# Patient Record
Sex: Female | Born: 1980 | Race: White | Hispanic: No | Marital: Married | Smoking: Never smoker
Health system: Southern US, Community
[De-identification: ages and names within clinical notes are randomized; demographics above are authoritative.]

## PROBLEM LIST (undated history)

## (undated) DIAGNOSIS — D62 Acute posthemorrhagic anemia: Secondary | ICD-10-CM

## (undated) DIAGNOSIS — Z1589 Genetic susceptibility to other disease: Secondary | ICD-10-CM

## (undated) DIAGNOSIS — E7212 Methylenetetrahydrofolate reductase deficiency: Secondary | ICD-10-CM

## (undated) DIAGNOSIS — O409XX0 Polyhydramnios, unspecified trimester, not applicable or unspecified: Secondary | ICD-10-CM

## (undated) DIAGNOSIS — J45909 Unspecified asthma, uncomplicated: Secondary | ICD-10-CM

## (undated) HISTORY — DX: Methylenetetrahydrofolate reductase deficiency: E72.12

## (undated) HISTORY — DX: Genetic susceptibility to other disease: Z15.89

## (undated) HISTORY — DX: Polyhydramnios, unspecified trimester, not applicable or unspecified: O40.9XX0

## (undated) HISTORY — DX: Unspecified asthma, uncomplicated: J45.909

## (undated) HISTORY — PX: NO PAST SURGERIES: SHX2092

---

## 2009-11-23 ENCOUNTER — Ambulatory Visit (HOSPITAL_COMMUNITY): Admission: RE | Admit: 2009-11-23 | Discharge: 2009-11-23 | Payer: Self-pay | Admitting: Obstetrics and Gynecology

## 2009-12-03 ENCOUNTER — Inpatient Hospital Stay (HOSPITAL_COMMUNITY): Admission: AD | Admit: 2009-12-03 | Discharge: 2009-12-03 | Payer: Self-pay | Admitting: Obstetrics and Gynecology

## 2010-03-24 ENCOUNTER — Inpatient Hospital Stay (HOSPITAL_COMMUNITY): Admission: AD | Admit: 2010-03-24 | Discharge: 2010-03-27 | Payer: Self-pay | Admitting: Obstetrics and Gynecology

## 2010-09-07 LAB — CBC
Hemoglobin: 12.1 g/dL (ref 12.0–15.0)
MCH: 30.4 pg (ref 26.0–34.0)
MCH: 30.1 pg (ref 26.0–34.0)
MCV: 87.3 fL (ref 78.0–100.0)
Platelets: 269 10*3/uL (ref 150–400)
RBC: 3.57 MIL/uL — ABNORMAL LOW (ref 3.87–5.11)
RDW: 14.6 % (ref 11.5–15.5)
RDW: 14.4 % (ref 11.5–15.5)
WBC: 9.4 10*3/uL (ref 4.0–10.5)

## 2010-09-11 LAB — URINALYSIS, ROUTINE W REFLEX MICROSCOPIC
Protein, ur: NEGATIVE mg/dL
Urobilinogen, UA: 0.2 mg/dL (ref 0.0–1.0)

## 2010-09-11 LAB — URINE CULTURE

## 2010-09-11 LAB — URINE MICROSCOPIC-ADD ON

## 2012-06-25 NOTE — L&D Delivery Note (Signed)
Delivery Note At 2:43 PM a viable female was delivered via Vaginal, Spontaneous Delivery (Presentation:DOA ;  ).  APGAR: 8/9, ; weight .  Pending. Easy delivery of shoulders after head. No nuchal cord Placenta status: ,intact/ 3-VC .  Cord:  with the following complications:none .  Cord pH: not indicated  Anesthesia: Epidural  Episiotomy: None Lacerations: 2nd degree;Perineal Suture Repair: 2.0 3.0 vicryl rapide Est. Blood Loss (mL): 350 cc  Mom to postpartum.  Baby to nursery-stable.  Annalysa Mohammad A. 01/06/2013, 3:04 PM

## 2012-07-08 LAB — OB RESULTS CONSOLE RPR: RPR: NONREACTIVE

## 2012-07-08 LAB — OB RESULTS CONSOLE HIV ANTIBODY (ROUTINE TESTING): HIV: NONREACTIVE

## 2012-07-08 LAB — OB RESULTS CONSOLE ABO/RH

## 2012-07-28 LAB — OB RESULTS CONSOLE GC/CHLAMYDIA: Gonorrhea: NEGATIVE

## 2012-12-12 LAB — OB RESULTS CONSOLE GBS: GBS: NEGATIVE

## 2012-12-30 ENCOUNTER — Encounter (HOSPITAL_COMMUNITY): Payer: Self-pay | Admitting: *Deleted

## 2012-12-30 ENCOUNTER — Telehealth (HOSPITAL_COMMUNITY): Payer: Self-pay | Admitting: *Deleted

## 2012-12-30 NOTE — Telephone Encounter (Signed)
Preadmission screen  

## 2013-01-05 ENCOUNTER — Other Ambulatory Visit: Payer: Self-pay | Admitting: Obstetrics

## 2013-01-05 NOTE — H&P (Signed)
Teresa Page is a 32 y.o. G2P1001 at 39'0 by LMP c/w 11 wk u/s  presenting for IOL due to polyhydramnios. Pt notes rare contractions. Good fetal movement, No vaginal bleeding, not leaking fluid.  [facility] at [facility] 11 wks - macrosomia. U/s at 38'6 revealed EFW at 4309g, 9'8, 97%. Pt aware risks of shoulder dystocia and permanent brachial plexus injury, maternal tissue trauma, risks of poor fetal perfusion during dystocia. Pt declines PCS for macrosomia and would like TOL. We discussed following her labor curve. AC 99%, 2 cm bigger than HC at 75%. BPP 8/8. 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 S/p MFM, recommendation not to change care due to this testing. Pt on additional folic acid and baby ASA daily - PUPPS. On hydroxyzine prn  - 24 # wt gain - b/l hand and feet fetal polydactyly with bone present in all extra digits   Prenatal Transfer Tool  Maternal Diabetes: No Genetic Screening: Declined Maternal Ultrasounds/Referrals: Abnormal:  Findings:   Other:macrosomia, polyhydramnios, polydactyly Fetal Ultrasounds or other Referrals:  None Maternal Substance Abuse:  No Significant Maternal Medications:  None Significant Maternal Lab Results: None     OB History   Grav Para Term Preterm Abortions TAB SAB Ect Mult Living   2 1 1       1      Past Medical History  Diagnosis Date  . Asthma   . Polyhydramnios   . MTHFR mutation    Past Surgical History  Procedure Laterality Date  . No past surgeries     Family History: family history includes Cancer in her paternal aunt; Diabetes in her father; Heart attack in her father and maternal grandfather; Heart disease in her father, paternal grandfather, and paternal uncle; Hypertension in her mother; and  Other in her sister. Social History:  reports that she has never smoked. She has never used smokeless tobacco. She reports that she does not drink alcohol or use illicit drugs.  Review of Systems - Negative except occasional contraction     Last menstrual period 04/08/2012, unknown if currently breastfeeding.  Physical Exam:  Gen: well appearing, no distress other than with contractions CV: RRR Pulm: CTAB Back: no CVAT Abd: gravid, NT, no RUQ pain LE: trace edema, equal bilaterally, non-tender   Prenatal labs: ABO, Rh: O/Positive/-- (01/14 0000) Antibody: Negative (01/14 0000) Rubella:  indeterminate RPR: Nonreactive (01/14 0000)  HBsAg: Negative (01/14 0000)  HIV: Non-reactive (01/14 0000)  GBS: Negative (06/20 0000)  1 hr Glucola 94  Genetic screening declined Anatomy US polydactyly   Assessment/Plan: 32 y.o. G2P1001 at [redacted]w[redacted]d - macrosomia, Careful attempt at IOL. Pt aware of risks, declines PCS, aware of shoulder dystocia -polyhydramnios, AROM once head engaged, risks of cord prolapse d/w pt - rubella indeterminate, check RPR PP, plan MMR if not immune - h/o PP depression, monitor carefully - fetal polydactyly. Alert peds - GBS neg - PUPPS, Continue prn hydroxyzine   Teresa Henzler A. 01/05/2013, 10:47 PM

## 2013-01-06 ENCOUNTER — Encounter (HOSPITAL_COMMUNITY): Payer: Self-pay | Admitting: Anesthesiology

## 2013-01-06 ENCOUNTER — Encounter (HOSPITAL_COMMUNITY): Payer: Self-pay

## 2013-01-06 ENCOUNTER — Inpatient Hospital Stay (HOSPITAL_COMMUNITY)
Admission: RE | Admit: 2013-01-06 | Discharge: 2013-01-08 | DRG: 775 | Disposition: A | Discharge status: Home / Self Care | Payer: Managed Care, Other (non HMO) | Source: Ambulatory Visit | Attending: Obstetrics | Admitting: Obstetrics

## 2013-01-06 ENCOUNTER — Inpatient Hospital Stay (HOSPITAL_COMMUNITY): Payer: Managed Care, Other (non HMO) | Admitting: Anesthesiology

## 2013-01-06 DIAGNOSIS — E7210 Disorders of sulfur-bearing amino-acid metabolism, unspecified: Secondary | ICD-10-CM | POA: Diagnosis present

## 2013-01-06 DIAGNOSIS — O3660X0 Maternal care for excessive fetal growth, unspecified trimester, not applicable or unspecified: Secondary | ICD-10-CM | POA: Diagnosis present

## 2013-01-06 DIAGNOSIS — D62 Acute posthemorrhagic anemia: Secondary | ICD-10-CM

## 2013-01-06 DIAGNOSIS — O9903 Anemia complicating the puerperium: Secondary | ICD-10-CM | POA: Diagnosis not present

## 2013-01-06 DIAGNOSIS — O358XX0 Maternal care for other (suspected) fetal abnormality and damage, not applicable or unspecified: Secondary | ICD-10-CM | POA: Diagnosis present

## 2013-01-06 DIAGNOSIS — O409XX0 Polyhydramnios, unspecified trimester, not applicable or unspecified: Principal | ICD-10-CM | POA: Diagnosis present

## 2013-01-06 HISTORY — DX: Acute posthemorrhagic anemia: D62

## 2013-01-06 LAB — TYPE AND SCREEN: ABO/RH(D): O POS

## 2013-01-06 LAB — CBC
Hemoglobin: 10.8 g/dL — ABNORMAL LOW (ref 12.0–15.0)
Platelets: 300 10*3/uL (ref 150–400)

## 2013-01-06 MED ORDER — OXYTOCIN 40 UNITS IN LACTATED RINGERS INFUSION - SIMPLE MED
INTRAVENOUS | Status: AC
  Filled 2013-01-06: qty 1000

## 2013-01-06 MED ORDER — CITRIC ACID-SODIUM CITRATE 334-500 MG/5ML PO SOLN: 30.0000 mL | ORAL | Status: DC | PRN

## 2013-01-06 MED ORDER — LANOLIN HYDROUS EX OINT: TOPICAL_OINTMENT | CUTANEOUS | Status: DC | PRN

## 2013-01-06 MED ORDER — SENNOSIDES-DOCUSATE SODIUM 8.6-50 MG PO TABS
2.0000 | ORAL_TABLET | Freq: Every day | ORAL | Status: DC
  Administered 2013-01-06 – 2013-01-07 (×2): 2 via ORAL

## 2013-01-06 MED ORDER — IBUPROFEN 600 MG PO TABS
600.0000 mg | ORAL_TABLET | Freq: Four times a day (QID) | ORAL | Status: DC
  Administered 2013-01-06 – 2013-01-08 (×8): 600 mg via ORAL
  Filled 2013-01-06 (×9): qty 1

## 2013-01-06 MED ORDER — OXYCODONE-ACETAMINOPHEN 5-325 MG PO TABS
1.0000 | ORAL_TABLET | ORAL | Status: DC | PRN
  Administered 2013-01-07 (×2): 1 via ORAL
  Filled 2013-01-06 (×2): qty 1

## 2013-01-06 MED ORDER — OXYTOCIN 40 UNITS IN LACTATED RINGERS INFUSION - SIMPLE MED: 62.5000 mL/h | INTRAVENOUS | Status: DC

## 2013-01-06 MED ORDER — LACTATED RINGERS IV SOLN: 500.0000 mL | INTRAVENOUS | Status: DC | PRN

## 2013-01-06 MED ORDER — IBUPROFEN 600 MG PO TABS: 600.0000 mg | ORAL_TABLET | Freq: Four times a day (QID) | ORAL | Status: DC | PRN

## 2013-01-06 MED ORDER — LIDOCAINE HCL (PF) 1 % IJ SOLN
INTRAMUSCULAR | Status: DC | PRN
  Administered 2013-01-06 (×2): 9 mL

## 2013-01-06 MED ORDER — LACTATED RINGERS IV SOLN: 500.0000 mL | Freq: Once | INTRAVENOUS | Status: DC

## 2013-01-06 MED ORDER — DIPHENHYDRAMINE HCL 50 MG/ML IJ SOLN: 12.5000 mg | INTRAMUSCULAR | Status: DC | PRN

## 2013-01-06 MED ORDER — PHENYLEPHRINE 40 MCG/ML (10ML) SYRINGE FOR IV PUSH (FOR BLOOD PRESSURE SUPPORT): 80.0000 ug | PREFILLED_SYRINGE | INTRAVENOUS | Status: DC | PRN

## 2013-01-06 MED ORDER — DIBUCAINE 1 % RE OINT: 1.0000 "application " | TOPICAL_OINTMENT | RECTAL | Status: DC | PRN

## 2013-01-06 MED ORDER — DIPHENHYDRAMINE HCL 25 MG PO CAPS: 25.0000 mg | ORAL_CAPSULE | Freq: Four times a day (QID) | ORAL | Status: DC | PRN

## 2013-01-06 MED ORDER — ONDANSETRON HCL 4 MG/2ML IJ SOLN: 4.0000 mg | INTRAMUSCULAR | Status: DC | PRN

## 2013-01-06 MED ORDER — SIMETHICONE 80 MG PO CHEW: 80.0000 mg | CHEWABLE_TABLET | ORAL | Status: DC | PRN

## 2013-01-06 MED ORDER — EPHEDRINE 5 MG/ML INJ
10.0000 mg | INTRAVENOUS | Status: DC | PRN
  Filled 2013-01-06: qty 4

## 2013-01-06 MED ORDER — ZOLPIDEM TARTRATE 5 MG PO TABS: 5.0000 mg | ORAL_TABLET | Freq: Every evening | ORAL | Status: DC | PRN

## 2013-01-06 MED ORDER — LACTATED RINGERS IV SOLN
INTRAVENOUS | Status: DC
  Administered 2013-01-06: 08:00:00 via INTRAVENOUS

## 2013-01-06 MED ORDER — LIDOCAINE HCL (PF) 1 % IJ SOLN
30.0000 mL | INTRAMUSCULAR | Status: DC | PRN
  Filled 2013-01-06: qty 30

## 2013-01-06 MED ORDER — FENTANYL 2.5 MCG/ML BUPIVACAINE 1/10 % EPIDURAL INFUSION (WH - ANES)
INTRAMUSCULAR | Status: DC | PRN
  Administered 2013-01-06: 14 mL/h via EPIDURAL

## 2013-01-06 MED ORDER — FLEET ENEMA 7-19 GM/118ML RE ENEM: 1.0000 | ENEMA | RECTAL | Status: DC | PRN

## 2013-01-06 MED ORDER — EPHEDRINE 5 MG/ML INJ: 10.0000 mg | INTRAVENOUS | Status: DC | PRN

## 2013-01-06 MED ORDER — OXYTOCIN BOLUS FROM INFUSION: 500.0000 mL | INTRAVENOUS | Status: DC

## 2013-01-06 MED ORDER — OXYCODONE-ACETAMINOPHEN 5-325 MG PO TABS: 1.0000 | ORAL_TABLET | ORAL | Status: DC | PRN

## 2013-01-06 MED ORDER — TERBUTALINE SULFATE 1 MG/ML IJ SOLN: 0.2500 mg | Freq: Once | INTRAMUSCULAR | Status: DC | PRN

## 2013-01-06 MED ORDER — FENTANYL 2.5 MCG/ML BUPIVACAINE 1/10 % EPIDURAL INFUSION (WH - ANES)
14.0000 mL/h | INTRAMUSCULAR | Status: DC | PRN
  Filled 2013-01-06: qty 125

## 2013-01-06 MED ORDER — TETANUS-DIPHTH-ACELL PERTUSSIS 5-2.5-18.5 LF-MCG/0.5 IM SUSP: 0.5000 mL | Freq: Once | INTRAMUSCULAR | Status: DC

## 2013-01-06 MED ORDER — PRENATAL MULTIVITAMIN CH
1.0000 | ORAL_TABLET | Freq: Every day | ORAL | Status: DC
  Administered 2013-01-07 – 2013-01-08 (×2): 1 via ORAL
  Filled 2013-01-06 (×2): qty 1

## 2013-01-06 MED ORDER — OXYTOCIN 40 UNITS IN LACTATED RINGERS INFUSION - SIMPLE MED
1.0000 m[IU]/min | INTRAVENOUS | Status: DC
  Administered 2013-01-06: 2 m[IU]/min via INTRAVENOUS

## 2013-01-06 MED ORDER — PHENYLEPHRINE 40 MCG/ML (10ML) SYRINGE FOR IV PUSH (FOR BLOOD PRESSURE SUPPORT)
80.0000 ug | PREFILLED_SYRINGE | INTRAVENOUS | Status: DC | PRN
  Filled 2013-01-06: qty 5

## 2013-01-06 MED ORDER — ACETAMINOPHEN 325 MG PO TABS: 650.0000 mg | ORAL_TABLET | ORAL | Status: DC | PRN

## 2013-01-06 MED ORDER — BENZOCAINE-MENTHOL 20-0.5 % EX AERO
1.0000 "application " | INHALATION_SPRAY | CUTANEOUS | Status: DC | PRN
  Administered 2013-01-08: 1 via TOPICAL
  Filled 2013-01-06: qty 56

## 2013-01-06 MED ORDER — FLEET ENEMA 7-19 GM/118ML RE ENEM: 1.0000 | ENEMA | Freq: Every day | RECTAL | Status: DC | PRN

## 2013-01-06 MED ORDER — WITCH HAZEL-GLYCERIN EX PADS: 1.0000 "application " | MEDICATED_PAD | CUTANEOUS | Status: DC | PRN

## 2013-01-06 MED ORDER — BISACODYL 10 MG RE SUPP
10.0000 mg | Freq: Every day | RECTAL | Status: DC | PRN
  Filled 2013-01-06: qty 1

## 2013-01-06 MED ORDER — ONDANSETRON HCL 4 MG PO TABS: 4.0000 mg | ORAL_TABLET | ORAL | Status: DC | PRN

## 2013-01-06 MED ORDER — ONDANSETRON HCL 4 MG/2ML IJ SOLN: 4.0000 mg | Freq: Four times a day (QID) | INTRAMUSCULAR | Status: DC | PRN

## 2013-01-06 NOTE — Anesthesia Procedure Notes (Signed)
Epidural Patient location during procedure: OB Start time: 01/06/2013 12:41 PM End time: 01/06/2013 12:45 PM  Staffing Anesthesiologist: Sandrea Hughs Performed by: anesthesiologist   Preanesthetic Checklist Completed: patient identified, surgical consent, pre-op evaluation, timeout performed, IV checked, risks and benefits discussed and monitors and equipment checked  Epidural Patient position: sitting Prep: site prepped and draped and DuraPrep Patient monitoring: continuous pulse ox and blood pressure Approach: midline Injection technique: LOR air  Needle:  Needle type: Tuohy  Needle gauge: 17 G Needle length: 9 cm and 9 Needle insertion depth: 6 cm Catheter type: closed end flexible Catheter size: 19 Gauge Catheter at skin depth: 11 cm Test dose: negative and Other  Assessment Sensory level: T9 Events: blood not aspirated, injection not painful, no injection resistance, negative IV test and no paresthesia  Additional Notes Reason for block:procedure for pain

## 2013-01-06 NOTE — Progress Notes (Signed)
Teresa Page is a 32 y.o. G2P1001 at [redacted]w[redacted]d IOL, polyhydramnios, nl DS screen, suspected LGA at 9 lbs.  Pt feeling some pain with UCs but not desiring medication or epidural yet.  AROM reviewed including risk for cord prolapse, understands and agrees.  FHT 140s/ no decels/ category I Toco on pitocin q 3 min.  SVE 4/ 90%/-3, floating, controlled AROM done, clear copious fluid, head well applied to cervix after AROM.  Continue labor with pitocin, Epidural as pt desired, Dr Ernestina Penna will be updated.   V.Tong Pieczynski, MD

## 2013-01-06 NOTE — Anesthesia Preprocedure Evaluation (Signed)
Anesthesia Evaluation  Patient identified by MRN, date of birth, ID band Patient awake    Reviewed: Allergy & Precautions, H&P , NPO status , Patient's Chart, lab work & pertinent test results  History of Anesthesia Complications (+) MALIGNANT HYPERTHERMIA  Airway Mallampati: II TM Distance: >3 FB Neck ROM: full    Dental no notable dental hx.    Pulmonary neg pulmonary ROS,    Pulmonary exam normal       Cardiovascular negative cardio ROS      Neuro/Psych negative neurological ROS  negative psych ROS   GI/Hepatic negative GI ROS, Neg liver ROS,   Endo/Other  negative endocrine ROS  Renal/GU negative Renal ROS  negative genitourinary   Musculoskeletal negative musculoskeletal ROS (+)   Abdominal Normal abdominal exam  (+)   Peds negative pediatric ROS (+)  Hematology negative hematology ROS (+)   Anesthesia Other Findings   Reproductive/Obstetrics (+) Pregnancy                           Anesthesia Physical Anesthesia Plan  ASA: II  Anesthesia Plan: Epidural   Post-op Pain Management:    Induction:   Airway Management Planned:   Additional Equipment:   Intra-op Plan:   Post-operative Plan:   Informed Consent: I have reviewed the patients History and Physical, chart, labs and discussed the procedure including the risks, benefits and alternatives for the proposed anesthesia with the patient or authorized representative who has indicated his/her understanding and acceptance.     Plan Discussed with:   Anesthesia Plan Comments:         Anesthesia Quick Evaluation

## 2013-01-07 ENCOUNTER — Encounter (HOSPITAL_COMMUNITY): Payer: Self-pay

## 2013-01-07 DIAGNOSIS — D62 Acute posthemorrhagic anemia: Secondary | ICD-10-CM

## 2013-01-07 HISTORY — DX: Acute posthemorrhagic anemia: D62

## 2013-01-07 LAB — CBC
HCT: 28.8 % — ABNORMAL LOW (ref 36.0–46.0)
Hemoglobin: 9.5 g/dL — ABNORMAL LOW (ref 12.0–15.0)
MCV: 81.4 fL (ref 78.0–100.0)
RDW: 14.6 % (ref 11.5–15.5)
WBC: 15.3 10*3/uL — ABNORMAL HIGH (ref 4.0–10.5)

## 2013-01-07 MED ORDER — POLYSACCHARIDE IRON COMPLEX 150 MG PO CAPS
150.0000 mg | ORAL_CAPSULE | Freq: Every day | ORAL | Status: DC
  Administered 2013-01-07 – 2013-01-08 (×2): 150 mg via ORAL
  Filled 2013-01-07 (×2): qty 1

## 2013-01-07 MED ORDER — MEASLES, MUMPS & RUBELLA VAC ~~LOC~~ INJ
0.5000 mL | INJECTION | Freq: Once | SUBCUTANEOUS | Status: AC
  Administered 2013-01-08: 0.5 mL via SUBCUTANEOUS
  Filled 2013-01-07 (×2): qty 0.5

## 2013-01-07 NOTE — Progress Notes (Signed)
UR chart review completed.  

## 2013-01-07 NOTE — Progress Notes (Signed)
Patient ID: Teresa Page, female   DOB: [birth year], 32 y.o.   MRN: [id number] PPD # 1 SVD  S:  Reports feeling well             Tolerating po/ No nausea or vomiting             Bleeding is moderate             Pain controlled with ibuprofen (OTC) and narcotic analgesics including Percocet             Up ad lib / ambulatory / voiding without difficulties    Newborn  Information for the patient's newborn:  Eleonora, Peeler [[id number]]  female  breast feeding    O:  A & O x 3, in no apparent distress              VS:  Filed Vitals:   01/06/13 1650 01/06/13 1750 01/06/13 2200 01/07/13 0615  BP: 115/71 105/64 107/58 96/65  Pulse: 105 96 100 70  Temp: 99.2 F (37.3 C) 99.6 F (37.6 C) 98.1 F (36.7 C) 98 F (36.7 C)  TempSrc: Oral Oral Oral Oral  Resp: 18 20 20 18   Height:      Weight:      SpO2:   99%     LABS:  Recent Labs  01/06/13 0810 01/07/13 0545  WBC 13.4* 15.3*  HGB 10.8* 9.5*  HCT 33.5* 28.8*  PLT 300 274    Blood type: --/--/O POS (07/15 0810)  Rubella: Equivocal (07/15 0810) - MMR done PP    Lungs: Clear and unlabored  Heart: regular rate and rhythm / no murmurs  Abdomen: soft, non-tender, non-distended, normal bowel sounds             Fundus: firm, non-tender, U-1  Perineum: 2nd degree repair intact, mild edema - ice pack in place  Lochia: moderate  Extremities: No edema, no calf pain or tenderness, No Homans    A/P: PPD # 1  32 y.o., [id number]   Active Problems:    Postpartum care following vaginal delivery (7/15)  Acute Blood Loss Anemia   Doing well - stable status  Routine post partum orders  Start iron supplements today  Anticipate discharge tomorrow    Raelyn Mora, M, MSN, CNM 01/07/2013, 12:09 PM

## 2013-01-07 NOTE — Plan of Care (Signed)
Problem: Discharge Progression Outcomes Goal: MMR given as ordered Outcome: Not Met (add Reason) Needs MMR  (rubella- equiv)

## 2013-01-08 MED ORDER — OXYCODONE-ACETAMINOPHEN 5-325 MG PO TABS: 1.0000 | ORAL_TABLET | Freq: Four times a day (QID) | ORAL | Status: DC | PRN

## 2013-01-08 MED ORDER — POLYSACCHARIDE IRON COMPLEX 150 MG PO CAPS: 150.0000 mg | ORAL_CAPSULE | Freq: Every day | ORAL | Status: DC

## 2013-01-08 MED ORDER — DOCUSATE SODIUM 100 MG PO CAPS: 100.0000 mg | ORAL_CAPSULE | Freq: Two times a day (BID) | ORAL | Status: DC | PRN

## 2013-01-08 MED ORDER — IBUPROFEN 600 MG PO TABS: 600.0000 mg | ORAL_TABLET | Freq: Four times a day (QID) | ORAL | Status: DC | PRN

## 2013-01-08 NOTE — Discharge Summary (Addendum)
Obstetric Discharge Summary  Teresa Page is a 32 y.o. G2P1001 at 39'0 by LMP c/w 11 wk u/s presenting for IOL due to polyhydramnios  Known pos MTHFR   Reason for Admission: induction of labor Prenatal Procedures: none Intrapartum Procedures: spontaneous vaginal delivery Postpartum Procedures: none Complications-Operative and Postpartum: 2nd degree perineal laceration Hemoglobin  Date Value Range Status  01/07/2013 9.5* 12.0 - 15.0 g/dL Final     HCT  Date Value Range Status  01/07/2013 28.8* 36.0 - 46.0 % Final    Physical Exam:  General: alert, cooperative and no distress Lochia: appropriate Uterine Fundus: firm DVT Evaluation: No evidence of DVT seen on physical exam. Negative Homan's sign.  Discharge Diagnoses: Term Pregnancy-delivered SVD with second degree laceration ABL anemia  Discharge Information: Date: 01/08/2013 Activity: pelvic rest Diet: routine Medications: Ibuprofen, Colace and Percocet Condition: stable Instructions: refer to practice specific booklet Discharge to: home Follow-up Information   Follow up with [facility] MD In 6 weeks.   Contact information:   [address] [place] [address] [phone]       Newborn Data: Live born female on [birth year]  Birth Weight: 8 lb 11 oz (3941 g) APGAR: 8, 9  Home with mother.  Teresa Page 01/08/2013, 9:07 AM

## 2013-01-08 NOTE — Progress Notes (Addendum)
PPD # 2 S/p induction and SVD with 2nd degree lac Subjective: Pt reports feeling well/ Pain controlled with prescription NSAID's including ibuprofen and narcotic analgesics including Percocet Tolerating po/ Voiding without problems/ No n/v Bleeding is light/ Newborn info:  Information for the patient's newborn:  Yaquelin, Langelier Girl Lynzi [[id number]]  female Feeding: breast    Objective:  VS: Blood pressure 104/67, pulse 76, temperature 97.8 F (36.6 C), temperature source Oral, resp. rate 18    Recent Labs  01/06/13 0810 01/07/13 0545  WBC 13.4* 15.3*  HGB 10.8* 9.5*  HCT 33.5* 28.8*  PLT 300 274    Blood type: --/--/O POS (07/15 0810) Rubella: 0.44 (07/15 0810)    Physical Exam:  General:  alert, cooperative and no distress CV: Regular rate and rhythm Resp: clear Abdomen: soft, nontender, normal bowel sounds Uterine Fundus: firm, below umbilicus, nontender Lochia: minimal Ext: extremities normal, atraumatic, no cyanosis or edema and Homans sign is negative, no sign of DVT    A/P: PPD # 2/ G2P2002/ S/P: IOL, s/p spont vaginal with second deg. Lac Mild ABL anemia Doing well and stable for discharge home  RX: Ibuprofen 600mg  po Q 6 hrs prn pain #30 Refill x 1 Percocet 5/325 1 to 2 po Q 4 hrs prn pain #15 No refill Colace 100 mg TID PRN constipations #30, No refill  WOB/GYN booklet given Routine pp visit in 6wks   TEDDER, Elisha Headland, RN, FNP Student  01/08/2013, 9:13 AM  Reviewed: Arlana Lindau, MSN, [facility] 01/08/13 10:48

## 2013-01-12 ENCOUNTER — Ambulatory Visit: Payer: Self-pay

## 2013-01-12 NOTE — Lactation Note (Signed)
This note was copied from the chart of Su Monks. Infant Lactation Consultation Outpatient Visit Note  Patient Name: Teresa Page          Date of Birth: 01/06/2015 Birth Weight:  8 lb 11 oz (3941 g) Gestational Age at Delivery: Gestational Age: [redacted]w[redacted]d Type of Delivery: SVB  Breastfeeding History Frequency of Breastfeeding: every 3 to 3 1/2 hours Length of Feeding: average of 20 minutes, 1 breast each feeding Voids: 5-7/day Stools: average 3 day since yesterday, meconium  Supplementing / Method: Pumping:  Type of Pump:  Medela Pump N Style   Frequency:  1 time per day 15-20 minutes  Volume:  55 ml yesterday  Comments: Mom is here for feeding assessment. She is using a #20 nipple shield to help with latch. Baby's d/c weight was 7 lb. 15 oz on Thursday, 01/08/13. Friday, 01/09/13 at Peds was 7 lb. 14 oz. Today the initial weight is 7 lb. 11.0 oz which is greater than 10 % weight loss at 22 days of age. Mom has been pumping 1 time per day. She is supplementing with EBM 5-10 ml via curved tipped syringe 1-2 times per day. She reports some nipple soreness that is slowly improving.   Consultation Evaluation: On exam, both nipples have some nipple excoriation, but the right is more red, no bleeding with breast feeding today. Mom is latching baby well using #20 nipple shield. She demonstrates good techniques, baby demonstrates a good suckling rhythm, some swallows audible. Some stimulation needed to keep baby awake and active at the breast. Baby  transferred 30 ml at the breast. Mom hand expressed another 37 ml and gave this to the baby via bottle with slow flow nipple.    Initial Feeding Assessment: Pre-feed Weight: 7 lb. 11.0 oz/3486 gm Post-feed Weight:  7 lb. 11.5 oz/3502 gm Amount Transferred:  16 ml.  Comments: from left breast with breastfeeding for 20 minutes in football hold.  Additional Feeding Assessment: Pre-feed Weight:  7 lb. 11.5 oz/3502 gm Post-feed Weight:  7 lb. 12.0 oz/3516  gm Amount Transferred:  14 ml Comments:  From right breast with breastfeeding for 15 minutes in cross cradle hold  Additional Feeding Assessment: Pre-feed Weight: Post-feed Weight: Amount Transferred: Comments:  Total Breast milk Transferred this Visit: 30  ml Total Supplement Given: 30 ml of EBM given via bottle with slow flow nipple  Additional Interventions: Discussed with parents the greater than 10% weight loss, meconium stools at 46 days of age, and baby not tranferring milk well at the breast.  Advised to begin supplements of 30 ml after each breastfeeding using EBM or formula. Mom used bottle at this visit to supplement but expressed interest in using SNS to supplement. She used SNS with 1st baby. Demonstrated set up and cleaning of SNS to parents and advised this is for short term use, 1-2 days. Discussed with parents that I would like feeding to not take longer than 45 minutes to 1 hour to decrease calorie usage with feeding.  Advised if using SNS to BF on 1st breast without the SNS, use SNS to supplement on 2nd breast each feeding. Alternating which breast she uses the SNS. Advised to BF with each feeding 15-20 minutes each breast, longer on 2nd breast if needed with SNS or use bottle with slow flow nipple to supplement.  Post pump for 15 minutes to protect milk supply and have EBM to supplement. Supplement after each feeding during the day. BF only at night.  Call OB  for RX of All Purpose Nipple Cream. Care for sore nipples reviewed. Mom also has comfort gels to alternate with nipple cream.   Follow-Up Smart Start RN Thursday, 01/15/13 Lactation OP follow up Tuesday, 01/20/13 at 4:00     Alfred Levins 01/12/2013, 4:36 PM

## 2014-04-26 ENCOUNTER — Encounter (HOSPITAL_COMMUNITY): Payer: Self-pay

## 2016-01-31 LAB — OB RESULTS CONSOLE RPR: RPR: NONREACTIVE

## 2016-01-31 LAB — OB RESULTS CONSOLE RUBELLA ANTIBODY, IGM: Rubella: NON-IMMUNE/NOT IMMUNE

## 2016-01-31 LAB — OB RESULTS CONSOLE HIV ANTIBODY (ROUTINE TESTING): HIV: NONREACTIVE

## 2016-01-31 LAB — OB RESULTS CONSOLE GC/CHLAMYDIA
Chlamydia: NEGATIVE
Gonorrhea: NEGATIVE

## 2016-01-31 LAB — OB RESULTS CONSOLE ABO/RH: RH Type: POSITIVE

## 2016-01-31 LAB — OB RESULTS CONSOLE ANTIBODY SCREEN: ANTIBODY SCREEN: NEGATIVE

## 2016-06-25 NOTE — L&D Delivery Note (Signed)
Delivery Note At 5:45 PM a viable female was delivered via Vaginal, Spontaneous Delivery (Presentation: ;DOA  ).  APGAR: 9, 9; weight  .   Placenta status: spontaneous, intact, .  Cord: 3VC with the following complications: .none  Cord pH: not indicated  Anesthesia:   Episiotomy: None Lacerations: 1st degree Suture Repair: 3.0 vicryl rapide 3 interupted figure of 8's Est. Blood Loss (mL): 250  Mom to postpartum.  Baby to Couplet care / Skin to Skin.  Cerita Rabelo A. 08/01/2016, 6:44 PM

## 2016-07-19 ENCOUNTER — Encounter (HOSPITAL_COMMUNITY): Payer: Self-pay | Admitting: *Deleted

## 2016-07-19 ENCOUNTER — Telehealth (HOSPITAL_COMMUNITY): Payer: Self-pay | Admitting: *Deleted

## 2016-07-19 ENCOUNTER — Other Ambulatory Visit: Payer: Self-pay | Admitting: Obstetrics

## 2016-07-19 LAB — OB RESULTS CONSOLE GBS: GBS: NEGATIVE

## 2016-07-19 NOTE — Telephone Encounter (Signed)
Preadmission screen  

## 2016-07-20 ENCOUNTER — Other Ambulatory Visit: Payer: Self-pay | Admitting: Obstetrics

## 2016-08-01 ENCOUNTER — Encounter (HOSPITAL_COMMUNITY): Payer: Self-pay

## 2016-08-01 ENCOUNTER — Inpatient Hospital Stay (HOSPITAL_COMMUNITY)
Admission: RE | Admit: 2016-08-01 | Discharge: 2016-08-03 | DRG: 775 | Disposition: A | Discharge status: Home / Self Care | Payer: Managed Care, Other (non HMO) | Source: Ambulatory Visit | Attending: Obstetrics & Gynecology | Admitting: Obstetrics & Gynecology

## 2016-08-01 DIAGNOSIS — Z3490 Encounter for supervision of normal pregnancy, unspecified, unspecified trimester: Secondary | ICD-10-CM

## 2016-08-01 DIAGNOSIS — Z833 Family history of diabetes mellitus: Secondary | ICD-10-CM

## 2016-08-01 DIAGNOSIS — Z8249 Family history of ischemic heart disease and other diseases of the circulatory system: Secondary | ICD-10-CM | POA: Diagnosis not present

## 2016-08-01 DIAGNOSIS — O9912 Other diseases of the blood and blood-forming organs and certain disorders involving the immune mechanism complicating childbirth: Secondary | ICD-10-CM | POA: Diagnosis present

## 2016-08-01 DIAGNOSIS — Z3A39 39 weeks gestation of pregnancy: Secondary | ICD-10-CM | POA: Diagnosis not present

## 2016-08-01 DIAGNOSIS — E7212 Methylenetetrahydrofolate reductase deficiency: Secondary | ICD-10-CM | POA: Diagnosis present

## 2016-08-01 DIAGNOSIS — Z3493 Encounter for supervision of normal pregnancy, unspecified, third trimester: Secondary | ICD-10-CM | POA: Diagnosis present

## 2016-08-01 LAB — CBC
HEMATOCRIT: 31.7 % — AB (ref 36.0–46.0)
HEMOGLOBIN: 10.4 g/dL — AB (ref 12.0–15.0)
MCH: 27.1 pg (ref 26.0–34.0)
MCHC: 32.8 g/dL (ref 30.0–36.0)
MCV: 82.6 fL (ref 78.0–100.0)
Platelets: 283 10*3/uL (ref 150–400)
RBC: 3.84 MIL/uL — ABNORMAL LOW (ref 3.87–5.11)
RDW: 15.4 % (ref 11.5–15.5)
WBC: 10 10*3/uL (ref 4.0–10.5)

## 2016-08-01 LAB — TYPE AND SCREEN
ABO/RH(D): O POS
ANTIBODY SCREEN: NEGATIVE

## 2016-08-01 MED ORDER — OXYCODONE-ACETAMINOPHEN 5-325 MG PO TABS: 2.0000 | ORAL_TABLET | ORAL | Status: DC | PRN

## 2016-08-01 MED ORDER — FENTANYL 2.5 MCG/ML BUPIVACAINE 1/10 % EPIDURAL INFUSION (WH - ANES)
14.0000 mL/h | INTRAMUSCULAR | Status: DC | PRN
  Filled 2016-08-01: qty 100

## 2016-08-01 MED ORDER — PHENYLEPHRINE 40 MCG/ML (10ML) SYRINGE FOR IV PUSH (FOR BLOOD PRESSURE SUPPORT)
80.0000 ug | PREFILLED_SYRINGE | INTRAVENOUS | Status: DC | PRN
  Filled 2016-08-01: qty 10
  Filled 2016-08-01: qty 5

## 2016-08-01 MED ORDER — LACTATED RINGERS IV SOLN
INTRAVENOUS | Status: DC
  Administered 2016-08-01: 11:00:00 via INTRAVENOUS

## 2016-08-01 MED ORDER — ACETAMINOPHEN 325 MG PO TABS
650.0000 mg | ORAL_TABLET | ORAL | Status: DC | PRN
  Filled 2016-08-01: qty 2

## 2016-08-01 MED ORDER — OXYCODONE-ACETAMINOPHEN 5-325 MG PO TABS: 1.0000 | ORAL_TABLET | ORAL | Status: DC | PRN

## 2016-08-01 MED ORDER — ONDANSETRON HCL 4 MG/2ML IJ SOLN: 4.0000 mg | INTRAMUSCULAR | Status: DC | PRN

## 2016-08-01 MED ORDER — TETANUS-DIPHTH-ACELL PERTUSSIS 5-2.5-18.5 LF-MCG/0.5 IM SUSP: 0.5000 mL | Freq: Once | INTRAMUSCULAR | Status: DC

## 2016-08-01 MED ORDER — ONDANSETRON HCL 4 MG/2ML IJ SOLN: 4.0000 mg | Freq: Four times a day (QID) | INTRAMUSCULAR | Status: DC | PRN

## 2016-08-01 MED ORDER — ACETAMINOPHEN 325 MG PO TABS
650.0000 mg | ORAL_TABLET | ORAL | Status: DC | PRN
  Administered 2016-08-02: 650 mg via ORAL

## 2016-08-01 MED ORDER — COCONUT OIL OIL
1.0000 "application " | TOPICAL_OIL | Status: DC | PRN
  Administered 2016-08-03: 1 via TOPICAL

## 2016-08-01 MED ORDER — SOD CITRATE-CITRIC ACID 500-334 MG/5ML PO SOLN: 30.0000 mL | ORAL | Status: DC | PRN

## 2016-08-01 MED ORDER — EPHEDRINE 5 MG/ML INJ
10.0000 mg | INTRAVENOUS | Status: DC | PRN
  Filled 2016-08-01: qty 4

## 2016-08-01 MED ORDER — OXYCODONE HCL 5 MG PO TABS: 10.0000 mg | ORAL_TABLET | ORAL | Status: DC | PRN

## 2016-08-01 MED ORDER — OXYTOCIN 40 UNITS IN LACTATED RINGERS INFUSION - SIMPLE MED: 2.5000 [IU]/h | INTRAVENOUS | Status: DC

## 2016-08-01 MED ORDER — LACTATED RINGERS IV SOLN: 500.0000 mL | INTRAVENOUS | Status: DC | PRN

## 2016-08-01 MED ORDER — SIMETHICONE 80 MG PO CHEW: 80.0000 mg | CHEWABLE_TABLET | ORAL | Status: DC | PRN

## 2016-08-01 MED ORDER — IBUPROFEN 600 MG PO TABS
600.0000 mg | ORAL_TABLET | Freq: Four times a day (QID) | ORAL | Status: DC
  Administered 2016-08-01 – 2016-08-03 (×8): 600 mg via ORAL
  Filled 2016-08-01 (×8): qty 1

## 2016-08-01 MED ORDER — PRENATAL MULTIVITAMIN CH
1.0000 | ORAL_TABLET | Freq: Every day | ORAL | Status: DC
  Administered 2016-08-02 – 2016-08-03 (×2): 1 via ORAL
  Filled 2016-08-01 (×2): qty 1

## 2016-08-01 MED ORDER — OXYCODONE HCL 5 MG PO TABS: 5.0000 mg | ORAL_TABLET | ORAL | Status: DC | PRN

## 2016-08-01 MED ORDER — OXYTOCIN BOLUS FROM INFUSION: 500.0000 mL | Freq: Once | INTRAVENOUS | Status: DC

## 2016-08-01 MED ORDER — BENZOCAINE-MENTHOL 20-0.5 % EX AERO: 1.0000 "application " | INHALATION_SPRAY | CUTANEOUS | Status: DC | PRN

## 2016-08-01 MED ORDER — OXYTOCIN 10 UNIT/ML IJ SOLN
INTRAMUSCULAR | Status: AC
  Filled 2016-08-01: qty 1

## 2016-08-01 MED ORDER — SENNOSIDES-DOCUSATE SODIUM 8.6-50 MG PO TABS
2.0000 | ORAL_TABLET | ORAL | Status: DC
  Administered 2016-08-02 (×2): 2 via ORAL
  Filled 2016-08-01 (×2): qty 2

## 2016-08-01 MED ORDER — DIBUCAINE 1 % RE OINT: 1.0000 "application " | TOPICAL_OINTMENT | RECTAL | Status: DC | PRN

## 2016-08-01 MED ORDER — LIDOCAINE HCL (PF) 1 % IJ SOLN
30.0000 mL | INTRAMUSCULAR | Status: DC | PRN
  Filled 2016-08-01: qty 30

## 2016-08-01 MED ORDER — OXYTOCIN 10 UNIT/ML IJ SOLN
10.0000 [IU] | Freq: Once | INTRAMUSCULAR | Status: AC
Start: 2016-08-01 — End: 2016-08-01
  Administered 2016-08-01: 10 [IU] via INTRAMUSCULAR

## 2016-08-01 MED ORDER — OXYTOCIN 40 UNITS IN LACTATED RINGERS INFUSION - SIMPLE MED
1.0000 m[IU]/min | INTRAVENOUS | Status: DC
  Administered 2016-08-01: 2 m[IU]/min via INTRAVENOUS
  Filled 2016-08-01: qty 1000

## 2016-08-01 MED ORDER — WITCH HAZEL-GLYCERIN EX PADS: 1.0000 "application " | MEDICATED_PAD | CUTANEOUS | Status: DC | PRN

## 2016-08-01 MED ORDER — ZOLPIDEM TARTRATE 5 MG PO TABS: 5.0000 mg | ORAL_TABLET | Freq: Every evening | ORAL | Status: DC | PRN

## 2016-08-01 MED ORDER — TERBUTALINE SULFATE 1 MG/ML IJ SOLN
0.2500 mg | Freq: Once | INTRAMUSCULAR | Status: DC | PRN
  Filled 2016-08-01: qty 1

## 2016-08-01 MED ORDER — ONDANSETRON HCL 4 MG PO TABS: 4.0000 mg | ORAL_TABLET | ORAL | Status: DC | PRN

## 2016-08-01 MED ORDER — DIPHENHYDRAMINE HCL 50 MG/ML IJ SOLN: 12.5000 mg | INTRAMUSCULAR | Status: DC | PRN

## 2016-08-01 MED ORDER — LACTATED RINGERS IV SOLN: 500.0000 mL | Freq: Once | INTRAVENOUS | Status: DC

## 2016-08-01 MED ORDER — PHENYLEPHRINE 40 MCG/ML (10ML) SYRINGE FOR IV PUSH (FOR BLOOD PRESSURE SUPPORT)
80.0000 ug | PREFILLED_SYRINGE | INTRAVENOUS | Status: DC | PRN
  Filled 2016-08-01: qty 5

## 2016-08-01 MED ORDER — DIPHENHYDRAMINE HCL 25 MG PO CAPS: 25.0000 mg | ORAL_CAPSULE | Freq: Four times a day (QID) | ORAL | Status: DC | PRN

## 2016-08-01 NOTE — Lactation Note (Signed)
This note was copied from a baby's chart. Lactation Consultation Note  Patient Name: Teresa Benna DunksRebecca Ems AVWUJ'WToday's Date: 08/01/2016 Reason for consult: Initial assessment   Initial [place] [facility]. Mom reports she had multiple issues with her 2 oldest- Milk supply issues requiring supplementation, SNS, breast shells, NS, yeast. She was able to BF the 2nd child for 2 years and weaned off the NS, this infant was given formula due to poor growth patterns after the NB period. 1st child she battled yeast and was not able to wean off the NS, this infant was also supplemented in the hospital.  Mom reports infant has fed on the right breast. Right breast is compressible with everted nipple, left breast is compressible, areolar tissue not very compressible and nipple is flat at rest, it does evert a little with stim, mom reports this is how they normally look. Mom unable to sit up very well right now, she feels she will do better with latching [name] when she can sit up a little better.   We attempted to latch infant to left breast without success, so we returned him to the right breast where he latched after a few tries. He was still feeding when I left the room. Informed mom I would leave breast shells and hand pump in her PP room. [name] was informed of issues and equipment in the room. Mom was informed that a NS may be needed on the left breast, mom ok with if needed. Feeding log given with instructions for use.   BF Resources handout and LC Brochure given, mom informed of IP/OP Services, BF Support Groups and LC phone #. Mom is to call out to desk if feeding assistance is needed. She has no questions/concerns at this time.      Maternal Data Formula Feeding for Exclusion: No Has patient been taught Hand Expression?: Yes Does the patient have breastfeeding experience prior to this delivery?: Yes  Feeding Feeding Type: Breast Fed  LATCH  Score/Interventions Latch: Repeated attempts needed to sustain latch, nipple held in mouth throughout feeding, stimulation needed to elicit sucking reflex. Intervention(s): Adjust position;Assist with latch;Breast massage;Breast compression  Audible Swallowing: A few with stimulation  Type of Nipple: Flat (right nipple everted, left nipple flat and aroela non compressible) Intervention(s): Hand pump;Shells  Comfort (Breast/Nipple): Soft / non-tender     Hold (Positioning): Assistance needed to correctly position infant at breast and maintain latch. Intervention(s): Breastfeeding basics reviewed;Support Pillows;Position options;Skin to skin  LATCH Score: 6  Lactation Tools Discussed/Used WIC Program: No   Consult Status Consult Status: Follow-up Date: 08/02/16 Follow-up type: In-patient    Teresa Page 08/01/2016, 7:00 PM

## 2016-08-01 NOTE — Anesthesia Pain Management Evaluation Note (Signed)
  CRNA Pain Management Visit Note  Patient: Teresa ForehandRebecca J Ignasiak, 36 y.o., female  "Hello I am a member of the anesthesia team at [facility]. We have an anesthesia team available at all times to provide care throughout the hospital, including epidural management and anesthesia for C-section. I don't know your plan for the delivery whether it a natural birth, water birth, IV sedation, nitrous supplementation, doula or epidural, but we want to meet your pain goals."   1.Was your pain managed to your expectations on prior hospitalizations?   Yes   2.What is your expectation for pain management during this hospitalization?     Epidural  3.How can we help you reach that goal?   Record the patient's initial score and the patient's pain goal.   Pain: 0  Pain Goal: 8 [facility] wants you to be able to say your pain was always managed very well.  Laban EmperorMalinova,Derita Michelsen Hristova 08/01/2016

## 2016-08-01 NOTE — Progress Notes (Signed)
Pt not feeling ctx, rate 1/10, good FM  Toco: q 2 min, pit at 14 munit/min FH 120s, + accels, no decels  A/P: reactive fetal testing, likely irritability as pt not feeling ctx OK to increase pitocin after reviewing R/B  Brettney Ficken A. 08/01/2016 3:25 PM

## 2016-08-01 NOTE — H&P (Signed)
Teresa Page is a 36 y.o. [id number] at 4445w4d presenting for elective IOL. Pt notes no contractions. Good fetal movement, No vaginal bleeding, not leaking fluid.  [facility] - Early CMV maternal infection. Elevated LFTS, viral syndrome, IgG high, IgM never elevated. Also has HSV1 IgG elevated. LFTS resolved over several week. Pt declined MFM consult and CMV avidity testing. Pt stated CMVinfection would not change decision for TOP  - Declines genetic screening  - Rubella N-I, check titers PP  - Baby with extra digits  - PUPPS  - posterior previa, resolved  - MTHFR heterozygous, on folic acid   Prenatal Transfer Tool  Maternal Diabetes: No Genetic Screening: Declined Maternal Ultrasounds/Referrals: Abnormal:  Findings:   Isolated EIF (echogenic intracardiac focus), Other: Fetal Ultrasounds or other Referrals:  None Maternal Substance Abuse:  No Significant Maternal Medications:  None Significant Maternal Lab Results: None     OB History    Gravida Para Term Preterm AB Living   4 2 2     2    SAB TAB Ectopic Multiple Live Births           2     Past Medical History:  Diagnosis Date  . Acute blood loss anemia 01/07/2013  . Asthma   . MTHFR mutation (HCC)   . Polyhydramnios    Past Surgical History:  Procedure Laterality Date  . NO PAST SURGERIES     Family History: family history includes Cancer in her paternal aunt; Diabetes in her father; Heart attack in her father and maternal grandfather; Heart disease in her father, paternal grandfather, and paternal uncle; Hypertension in her mother; Other in her sister. Social History:  reports that she has never smoked. She has never used smokeless tobacco. She reports that she does not drink alcohol or use drugs.  Review of Systems - Negative except discomfort of pregnancy   Dilation: 3.5 Effacement (%): 50, 60 Station: -3 Exam by:: Dr. Ernestina PennaFogleman Blood pressure 131/89, pulse (!) 135, temperature  98.2 F (36.8 C), temperature source Oral, resp. rate 18, height 5\' 6"  (1.676 m), weight 87.5 kg (193 lb), unknown if currently breastfeeding.   Vitals:   08/01/16 1233 08/01/16 1301 08/01/16 1331 08/01/16 1401  BP: 113/68 (!) 107/59 102/62 131/89  Pulse: 86 69 74 (!) 135  Resp: 18 17 18 18   Temp:   98.2 F (36.8 C)   TempSrc:   Oral   Weight:      Height:         Physical Exam:  Gen: well appearing, no distress  Abd: gravid, NT, no RUQ pain LE: no edema, equal bilaterally, non-tender Toco: q 3 min, pit at 10 munits/ min FH: baseline 120s, accelerations present, no deceleratons, 10 beat variability  Cvx: 3/ 20%/ posterior/ ballotable vtx, no bulging bag- unable to AROM  Prenatal labs: ABO, Rh: --/--/O POS (02/07 1011) Antibody: NEG (02/07 1011) Rubella: !Error! Non-Immune RPR: Nonreactive (08/08 0000)  HBsAg:   neg HIV: Non-reactive (08/08 0000)  GBS: Negative (01/25 0000)  1 hr Glucola 96  Genetic screening declined Anatomy US normal   Assessment/Plan: 36 y.o. [id number] at 7345w4d Term, multip. Elective IOL. Continue pitocin. AROM when able.  Reactive fetal testing.    Anthonymichael Munday A. 08/01/2016, 2:13 PM

## 2016-08-02 LAB — RPR: RPR Ser Ql: NONREACTIVE

## 2016-08-02 NOTE — Progress Notes (Signed)
PPD # 1 SVD Information for the patient's newborn:  Ruffin FrederickMarks, Boy Bhavika [[id number]][[id number]]  female    breast feeding, L side inverted, using nipple shield and shell when inf not at breast.  Same with previous children.  Will see LC outpatient. Circumcision planned Baby name: Viviann Spareyler  S:  Reports feeling sore and with back/hip pain.               Tolerating po/ No nausea or vomiting             Bleeding is light             Pain controlled with ibuprofen, heat pad, ice packs             Up ad lib / ambulatory - with minimal assistance  voiding without difficulties      O:  A & O x 3, in no apparent distress              VS:  Vitals:   08/01/16 2023 08/01/16 2153 08/02/16 0152 08/02/16 0820  BP: (!) 115/59 119/63 (!) 126/56 112/64  Pulse: 84 89 73 86  Resp: 16 18 18 18   Temp: 98.7 F (37.1 C) 98.6 F (37 C) 98.2 F (36.8 C) 98 F (36.7 C)  TempSrc: Oral Oral Oral Oral  SpO2:  98%  100%  Weight:      Height:        LABS:  Recent Labs  08/01/16 1011  WBC 10.0  HGB 10.4*  HCT 31.7*  PLT 283    Blood type: --/--/O POS (02/07 1011)  Rubella: Nonimmune (08/08 0000)   I&O: I/O last 3 completed shifts: In: -  Out: 250 [Blood:250]          No intake/output data recorded.  Abdomen: soft, non-tender, non-distended, laxity             Fundus: firm, non-tender, U-1  Perineum: no edema  Lochia: moderate  Extremities: trace pedal edema, no calf pain or tenderness    A/P: PPD # 1 36 y.o., [id number]   Principal Problem:   Postpartum care following vaginal delivery (2/7)   Doing well - stable status  Abd binder while ambulatory.  Encouraged to get out of bed, ambulate, or sit in chair.  Continue working with Nature conservation officerlactacton consultant  Routine post partum orders  Anticipate discharge tomorrow    Kathlene NovemberCinthya Bowmaker-Kareem, BSN, SNM 08/02/2016, 10:03 AM

## 2016-08-02 NOTE — Lactation Note (Signed)
This note was copied from a baby's chart. Lactation Consultation Note  Patient Name: Teresa Benna DunksRebecca Vandevelde WUJWJ'XToday's Date: 08/02/2016 Reason for consult: Follow-up assessment   Follow up with mom of 22 hour old infant. Mom reports [name] latches well sometimes and not others. She is using the NS on the left breast and breast shells. [name] usually feeds on her right breast per mom. Her left nipple is more erect today. Infant cueing to feed, mom latched him to the left breast with the # 24 NS, infant did not latch well. We took NS off and he latched for about 10 sucks then fell asleep. Mom holding infant STS.   Due to history of low milk supply and [name] not willing to latch to the left breast well, DEBP set up with instructions to use on Initiate setting, assembling, disassembling and cleaning of pump parts. Enc mom to pump post BF and feed all infant EBM in NS or with curved tip syringe and finger feeding. Parents with no further questions/concerns at this time.    Maternal Data Formula Feeding for Exclusion: No Has patient been taught Hand Expression?: Yes Does the patient have breastfeeding experience prior to this delivery?: Yes  Feeding Feeding Type: Breast Fed Length of feed: 22 min  LATCH Score/Interventions Latch: Too sleepy or reluctant, no latch achieved, no sucking elicited. Intervention(s): Skin to skin;Teach feeding cues;Waking techniques Intervention(s): Breast massage;Breast compression  Audible Swallowing: None Intervention(s): Alternate breast massage;Hand expression;Skin to skin  Type of Nipple: Everted at rest and after stimulation Intervention(s): Double electric pump;Hand pump;Shells  Comfort (Breast/Nipple): Filling, red/small blisters or bruises, mild/mod discomfort  Problem noted: Mild/Moderate discomfort Interventions (Mild/moderate discomfort): Hand expression  Hold (Positioning): No assistance needed to correctly position infant at breast. Intervention(s):  Breastfeeding basics reviewed;Support Pillows;Position options;Skin to skin  LATCH Score: 5  Lactation Tools Discussed/Used Tools: Nipple Shields Nipple shield size: 24 Pump Review: Setup, frequency, and cleaning;Milk Storage Initiated by:: Noralee StainSharon Pema Thomure, RN, IBCLC Date initiated:: 08/02/16   Consult Status Consult Status: Follow-up Date: 08/03/16 Follow-up type: In-patient    [name] 08/02/2016, 4:04 PM

## 2016-08-03 LAB — RUBELLA ANTIBODY, IGM: Rubella IgM: <20 AU/mL (ref 0.0–19.9)

## 2016-08-03 MED ORDER — MEASLES, MUMPS & RUBELLA VAC ~~LOC~~ INJ
0.5000 mL | INJECTION | Freq: Once | SUBCUTANEOUS | Status: AC
  Administered 2016-08-03: 0.5 mL via SUBCUTANEOUS
  Filled 2016-08-03: qty 0.5

## 2016-08-03 MED ORDER — IBUPROFEN 600 MG PO TABS: 600.0000 mg | ORAL_TABLET | Freq: Four times a day (QID) | ORAL | 0 refills | Status: AC

## 2016-08-03 NOTE — Progress Notes (Signed)
Post Partum Day #2           Information for the patient's newborn:  Nahdia, Doucet [[id number]]  female  circumcision done  Baby name: Dorothea Ogle Feeding: breast  Subjective: No HA, SOB, CP, F/C, breast symptoms.  Pain in her back stay round 4/10.  Abd binder providers relief when up and ambulatory. Additional relief from Motrin and heat packs. Bleeding more than last two pregnancies, no clots.     Breastfeeding mostly from right breast because inf has difficulty latching onto left, same with previous children. Working with Loss adjuster, chartered, pumping after feedings. Voiding without difficulty.  Objective:  VS:  Vitals:   08/02/16 0152 08/02/16 0820 08/02/16 1853 08/03/16 0623  BP: (!) 126/56 112/64 (!) 104/55 117/69  Pulse: 73 86 86 79  Resp: '18 18 18 18  ' Temp: 98.2 F (36.8 C) 98 F (36.7 C) 98.5 F (36.9 C) 98 F (36.7 C)  TempSrc: Oral Oral Oral Oral  SpO2:  100%    Weight:      Height:        No intake or output data in the 24 hours ending 08/03/16 0947     Recent Labs  08/01/16 1011  WBC 10.0  HGB 10.4*  HCT 31.7*  PLT 283    Blood type: --/--/O POS (02/07 1011) Rubella: <20.0 (02/07 1011)    Physical Exam:  General: alert, cooperative and no distress Uterine Fundus: firm Lochia: appropriate, moderate Perineum: repair 1st degree, no edema  Extremities: +1 edema.  No evidence of DVT seen on physical exam. Negative Homan's sign. No cords or calf tenderness.   Assessment/Plan: PPD # 2 / 36 y.o., [id number] S/P:spontaneous vaginal   Principal Problem:   Postpartum care following vaginal delivery (2/7)   Normal postpartum exam  Continue current postpartum care  Rubella non-immune, MMR offered  Continue abd binder when ambulating for back pain  Has resources for outpatient lactation consultant if needing further assistance  Call office if increased bleeding, saturating pad in 1 hr x2, or clots larger than golf ball size             DC home today w/  instructions and booklet  F/U at [place] in 6 weeks and PRN   LOS: 2 days   Crista Luria, BSN, SNM 08/03/2016, 9:47 AM

## 2016-08-03 NOTE — Discharge Summary (Signed)
Obstetric Discharge Summary Reason for Admission: onset of labor Prenatal Procedures: ultrasound Intrapartum Procedures: spontaneous vaginal delivery Postpartum Procedures: Rubella Ig non-immune, MMR offered Complications-Operative and Postpartum: 1st degree perineal laceration, CMV inf during pregnancy, MTHFR heterozygous Hemoglobin  Date Value Ref Range Status  08/01/2016 10.4 (L) 12.0 - 15.0 g/dL Final   HCT  Date Value Ref Range Status  08/01/2016 31.7 (L) 36.0 - 46.0 % Final    Physical Exam:  General: alert, cooperative and no distress Lochia: appropriate Uterine Fundus: firm DVT Evaluation: No evidence of DVT seen on physical exam. Negative Homan's sign. No cords or calf tenderness. 1+ pedal edema  Discharge Diagnoses: Term Pregnancy-delivered  Discharge Information: Date: 08/03/2016 Activity: unrestricted and pelvic rest Diet: routine Medications: PNV and Ibuprofen Condition: stable Instructions: refer to practice specific booklet Discharge to: home   Newborn Data: Live born female - Dorothea Ogle Birth Weight: 8 lb 15.4 oz (4065 g) APGAR: 9, 9  Home with mother.  Crista Luria, BSN, SNM 08/03/2016, 10:19 AM   Artelia Laroche, CNM [facility] 08/03/2016 @ [id number]

## 2016-08-03 NOTE — Lactation Note (Addendum)
This note was copied from a baby's chart. Lactation Consultation Note  Baby 2341 hours old.  P3.   Mother's milk is transitioning.  She recently pumped 50 ml +. Gave baby 16 ml w/ finger and curved tip syring with mother's assistance. After baby latched well on R breast.  Spontaneous sucks and swallows observed. Mother will continue to breastfeed and give supplemental breastmilk after. She has more difficulty latching on L side but baby latches well on R side. Mother has DEBP at home.  Provided volume guidelines for breastmilk. If needed mother will use #24NS on L side. Mom encouraged to feed baby 8-12 times/24 hours and with feeding cues.  Reviewed engorgement care and monitoring voids/stools.   Patient Name: Boy Benna DunksRebecca Tremain ZOXWR'UToday's Date: 08/03/2016 Reason for consult: Follow-up assessment   Maternal Data    Feeding Feeding Type: Breast Fed Length of feed:  (few sucks)  LATCH Score/Interventions Latch: Repeated attempts needed to sustain latch, nipple held in mouth throughout feeding, stimulation needed to elicit sucking reflex.  Audible Swallowing: Spontaneous and intermittent Intervention(s): Skin to skin;Hand expression  Type of Nipple: Everted at rest and after stimulation Intervention(s): Double electric pump;Shells  Comfort (Breast/Nipple): Filling, red/small blisters or bruises, mild/mod discomfort  Problem noted: Mild/Moderate discomfort Interventions (Mild/moderate discomfort): Hand expression;Post-pump;Breast shields  Hold (Positioning): No assistance needed to correctly position infant at breast.  LATCH Score: 8  Lactation Tools Discussed/Used     Consult Status Consult Status: Complete    Hardie PulleyBerkelhammer, Ruth Boschen 08/03/2016, 11:46 AM

## 2018-03-22 DIAGNOSIS — Z6828 Body mass index (BMI) 28.0-28.9, adult: Secondary | ICD-10-CM | POA: Diagnosis not present

## 2018-03-22 DIAGNOSIS — N390 Urinary tract infection, site not specified: Secondary | ICD-10-CM | POA: Diagnosis not present

## 2018-03-22 DIAGNOSIS — R350 Frequency of micturition: Secondary | ICD-10-CM | POA: Diagnosis not present

## 2019-11-20 DIAGNOSIS — Z131 Encounter for screening for diabetes mellitus: Secondary | ICD-10-CM | POA: Diagnosis not present

## 2019-11-20 DIAGNOSIS — Z1151 Encounter for screening for human papillomavirus (HPV): Secondary | ICD-10-CM | POA: Diagnosis not present

## 2019-11-20 DIAGNOSIS — Z130 Encounter for screening for diseases of the blood and blood-forming organs and certain disorders involving the immune mechanism: Secondary | ICD-10-CM | POA: Diagnosis not present

## 2019-11-20 DIAGNOSIS — Z0000 Encounter for general adult medical examination without abnormal findings: Secondary | ICD-10-CM | POA: Diagnosis not present

## 2019-11-20 DIAGNOSIS — Z01419 Encounter for gynecological examination (general) (routine) without abnormal findings: Secondary | ICD-10-CM | POA: Diagnosis not present

## 2019-11-20 DIAGNOSIS — Z1231 Encounter for screening mammogram for malignant neoplasm of breast: Secondary | ICD-10-CM | POA: Diagnosis not present

## 2019-11-20 DIAGNOSIS — Z1329 Encounter for screening for other suspected endocrine disorder: Secondary | ICD-10-CM | POA: Diagnosis not present

## 2019-11-20 DIAGNOSIS — Z6831 Body mass index (BMI) 31.0-31.9, adult: Secondary | ICD-10-CM | POA: Diagnosis not present

## 2019-11-20 DIAGNOSIS — Z13220 Encounter for screening for lipoid disorders: Secondary | ICD-10-CM | POA: Diagnosis not present

## 2019-12-23 DIAGNOSIS — Z713 Dietary counseling and surveillance: Secondary | ICD-10-CM | POA: Diagnosis not present

## 2019-12-23 DIAGNOSIS — Z6831 Body mass index (BMI) 31.0-31.9, adult: Secondary | ICD-10-CM | POA: Diagnosis not present

## 2020-01-29 DIAGNOSIS — Z713 Dietary counseling and surveillance: Secondary | ICD-10-CM | POA: Diagnosis not present

## 2020-01-29 DIAGNOSIS — Z6825 Body mass index (BMI) 25.0-25.9, adult: Secondary | ICD-10-CM | POA: Diagnosis not present
